# Patient Record
Sex: Male | Born: 1970 | Race: Black or African American | Hispanic: No | Marital: Single | Smoking: Never smoker
Health system: Southern US, Community
[De-identification: ages and names within clinical notes are randomized; demographics above are authoritative.]

---

## 2018-05-16 HISTORY — PX: GANGLION CYST EXCISION: SHX1691

## 2018-11-28 ENCOUNTER — Other Ambulatory Visit: Payer: Self-pay

## 2018-11-28 ENCOUNTER — Emergency Department (HOSPITAL_COMMUNITY): Payer: No Typology Code available for payment source

## 2018-11-28 ENCOUNTER — Emergency Department (HOSPITAL_COMMUNITY)
Admission: EM | Admit: 2018-11-28 | Discharge: 2018-11-28 | Disposition: A | Discharge status: Home / Self Care | Payer: No Typology Code available for payment source | Attending: Emergency Medicine | Admitting: Emergency Medicine

## 2018-11-28 ENCOUNTER — Encounter (HOSPITAL_COMMUNITY): Payer: Self-pay

## 2018-11-28 DIAGNOSIS — M25532 Pain in left wrist: Secondary | ICD-10-CM | POA: Insufficient documentation

## 2018-11-28 DIAGNOSIS — M542 Cervicalgia: Secondary | ICD-10-CM | POA: Diagnosis not present

## 2018-11-28 DIAGNOSIS — Y92410 Unspecified street and highway as the place of occurrence of the external cause: Secondary | ICD-10-CM | POA: Insufficient documentation

## 2018-11-28 DIAGNOSIS — Y998 Other external cause status: Secondary | ICD-10-CM | POA: Insufficient documentation

## 2018-11-28 DIAGNOSIS — Y9389 Activity, other specified: Secondary | ICD-10-CM | POA: Insufficient documentation

## 2018-11-28 MED ORDER — IBUPROFEN 800 MG PO TABS
800.0000 mg | ORAL_TABLET | Freq: Once | ORAL | Status: AC
  Administered 2018-11-28: 21:00:00 800 mg via ORAL
  Filled 2018-11-28: qty 1

## 2018-11-28 MED ORDER — METHOCARBAMOL 500 MG PO TABS: 500.0000 mg | ORAL_TABLET | Freq: Four times a day (QID) | ORAL | 0 refills | Status: AC

## 2018-11-28 MED ORDER — IBUPROFEN 600 MG PO TABS: 600.0000 mg | ORAL_TABLET | Freq: Four times a day (QID) | ORAL | 0 refills | Status: AC | PRN

## 2018-11-28 NOTE — Discharge Instructions (Addendum)
See your Physician for recheck if pain persist ?
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
